# Patient Record
Sex: Female | Born: 2005 | Race: White | Hispanic: No | Marital: Single | Smoking: Never smoker
Health system: Southern US, Community
[De-identification: ages and names within clinical notes are randomized; demographics above are authoritative.]

## PROBLEM LIST (undated history)

## (undated) DIAGNOSIS — K0889 Other specified disorders of teeth and supporting structures: Secondary | ICD-10-CM

## (undated) DIAGNOSIS — F819 Developmental disorder of scholastic skills, unspecified: Secondary | ICD-10-CM

## (undated) DIAGNOSIS — E063 Autoimmune thyroiditis: Secondary | ICD-10-CM

## (undated) DIAGNOSIS — Q780 Osteogenesis imperfecta: Secondary | ICD-10-CM

## (undated) DIAGNOSIS — F988 Other specified behavioral and emotional disorders with onset usually occurring in childhood and adolescence: Secondary | ICD-10-CM

## (undated) DIAGNOSIS — E301 Precocious puberty: Secondary | ICD-10-CM

## (undated) DIAGNOSIS — K5909 Other constipation: Secondary | ICD-10-CM

## (undated) DIAGNOSIS — R6250 Unspecified lack of expected normal physiological development in childhood: Secondary | ICD-10-CM

## (undated) DIAGNOSIS — F409 Phobic anxiety disorder, unspecified: Secondary | ICD-10-CM

## (undated) DIAGNOSIS — G43909 Migraine, unspecified, not intractable, without status migrainosus: Secondary | ICD-10-CM

## (undated) HISTORY — DX: Osteogenesis imperfecta: Q78.0

## (undated) HISTORY — PX: ORIF RADIUS & ULNA FRACTURES: SHX2129

## (undated) HISTORY — PX: FOREARM HARDWARE REMOVAL: SHX1675

## (undated) HISTORY — PX: TONSILLECTOMY AND ADENOIDECTOMY: SHX28

---
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
## 2024-01-01 ENCOUNTER — Ambulatory Visit
Admission: RE | Admit: 2024-01-01 | Discharge: 2024-01-01 | Disposition: A | Discharge status: Home / Self Care | Source: Ambulatory Visit | Attending: Medical | Admitting: Medical

## 2024-01-01 DIAGNOSIS — M25551 Pain in right hip: Secondary | ICD-10-CM

## 2024-01-09 ENCOUNTER — Encounter (HOSPITAL_COMMUNITY): Payer: Self-pay

## 2024-01-09 ENCOUNTER — Inpatient Hospital Stay (HOSPITAL_COMMUNITY): Admission: RE | Admit: 2024-01-09 | Source: Ambulatory Visit

## 2024-01-09 ENCOUNTER — Other Ambulatory Visit (HOSPITAL_COMMUNITY): Payer: Self-pay | Admitting: Family Medicine

## 2024-01-09 DIAGNOSIS — R52 Pain, unspecified: Secondary | ICD-10-CM

## 2024-01-12 ENCOUNTER — Other Ambulatory Visit: Payer: Self-pay | Admitting: Family Medicine

## 2024-01-12 DIAGNOSIS — M545 Low back pain, unspecified: Secondary | ICD-10-CM

## 2024-01-15 ENCOUNTER — Ambulatory Visit
Admission: RE | Admit: 2024-01-15 | Discharge: 2024-01-15 | Disposition: A | Discharge status: Home / Self Care | Source: Ambulatory Visit | Attending: Family Medicine | Admitting: Family Medicine

## 2024-01-15 DIAGNOSIS — M545 Low back pain, unspecified: Secondary | ICD-10-CM
